# Patient Record
Sex: Male | Born: 1970 | Race: Black or African American | Hispanic: No | Marital: Married | Smoking: Never smoker
Health system: Southern US, Community
[De-identification: ages and names within clinical notes are randomized; demographics above are authoritative.]

## PROBLEM LIST (undated history)

## (undated) DIAGNOSIS — R0683 Snoring: Secondary | ICD-10-CM

## (undated) DIAGNOSIS — Z9989 Dependence on other enabling machines and devices: Principal | ICD-10-CM

## (undated) DIAGNOSIS — I10 Essential (primary) hypertension: Secondary | ICD-10-CM

## (undated) DIAGNOSIS — G4733 Obstructive sleep apnea (adult) (pediatric): Principal | ICD-10-CM

## (undated) DIAGNOSIS — E662 Morbid (severe) obesity with alveolar hypoventilation: Secondary | ICD-10-CM

## (undated) DIAGNOSIS — Z6841 Body Mass Index (BMI) 40.0 and over, adult: Principal | ICD-10-CM

## (undated) HISTORY — DX: Morbid (severe) obesity with alveolar hypoventilation: E66.2

## (undated) HISTORY — DX: Snoring: R06.83

## (undated) HISTORY — DX: Obstructive sleep apnea (adult) (pediatric): G47.33

## (undated) HISTORY — DX: Dependence on other enabling machines and devices: Z99.89

## (undated) HISTORY — DX: Morbid (severe) obesity due to excess calories: E66.01

## (undated) HISTORY — DX: Body Mass Index (BMI) 40.0 and over, adult: Z684

## (undated) HISTORY — DX: Essential (primary) hypertension: I10

---

## 2006-10-06 HISTORY — PX: PITUITARY SURGERY: SHX203

## 2006-12-06 ENCOUNTER — Emergency Department: Payer: Self-pay | Admitting: Emergency Medicine

## 2006-12-11 ENCOUNTER — Ambulatory Visit: Payer: Self-pay | Admitting: Emergency Medicine

## 2008-04-16 IMAGING — CT CT HEAD WITHOUT CONTRAST
2 series · 15 of 30 positions shown, 19 images · non-contrast
Comparison: none

REASON FOR EXAM: headache
COMMENTS:

PROCEDURE:     CT  - CT HEAD WITHOUT CONTRAST  - December 07, 2006  [DATE]
RESULT:
HISTORY: Headache.

[Series 2: without · axial · non-contrast · 0.47mm/px · z∈[-10,+110]mm · 13 of 30 slices shown, 17 images]
[im 3/30  brain]
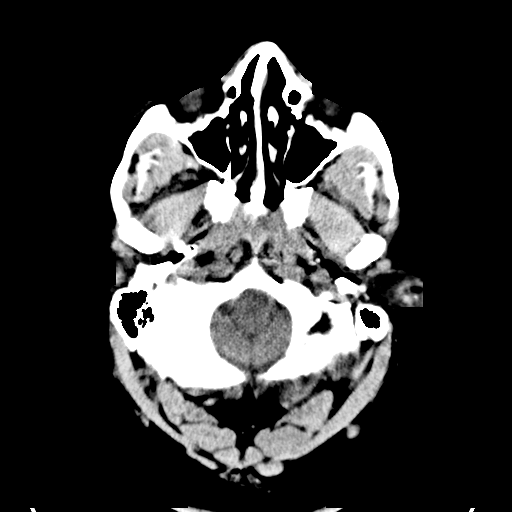
[im 3/30  bone]
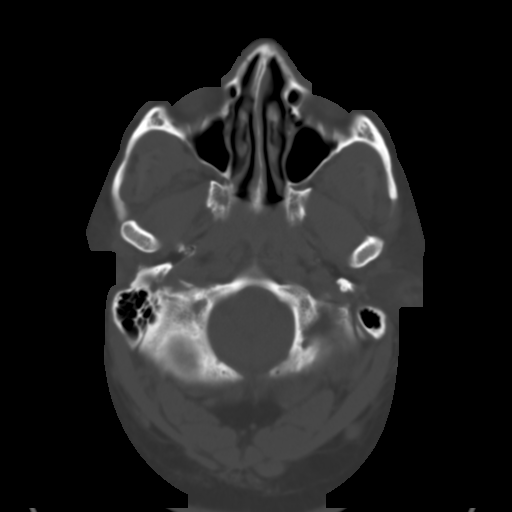
[im 5/30  brain]
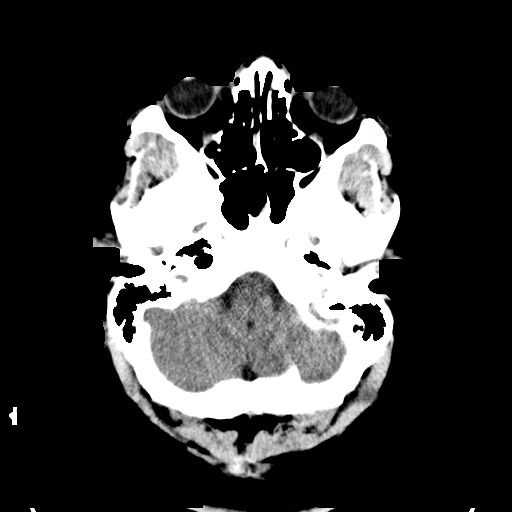
[im 7/30  brain]
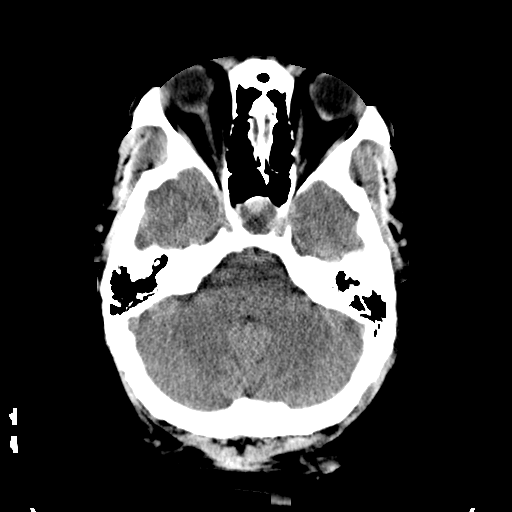
[im 9/30  brain]
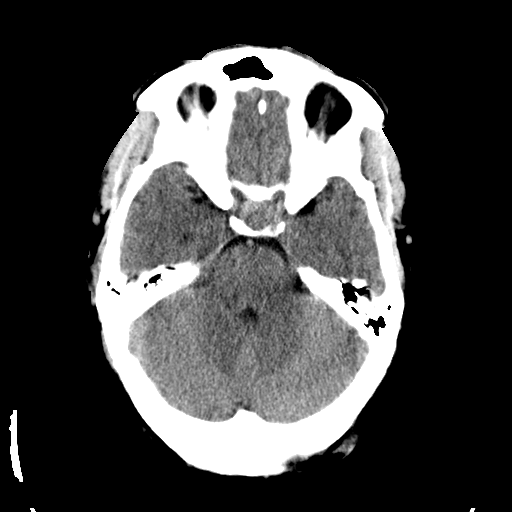
[im 11/30  brain]
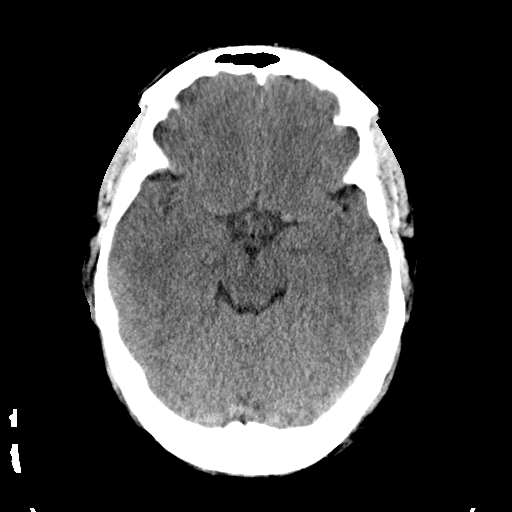
[im 11/30  bone]
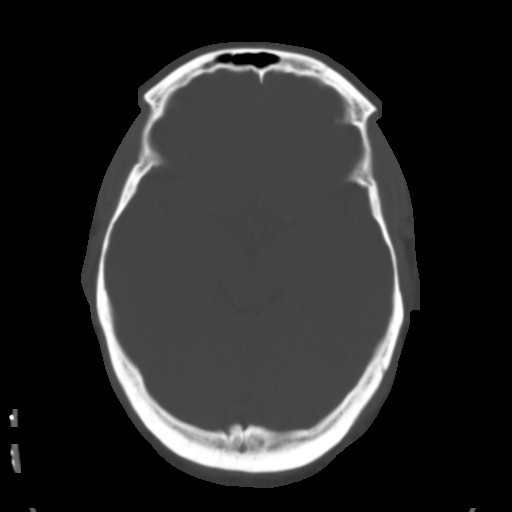
[im 13/30  brain]
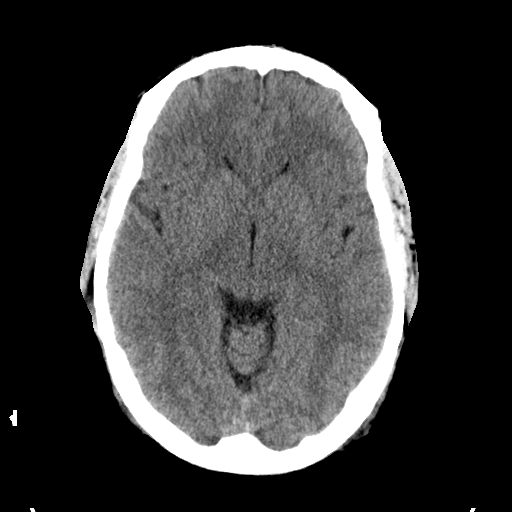
[im 15/30  brain]
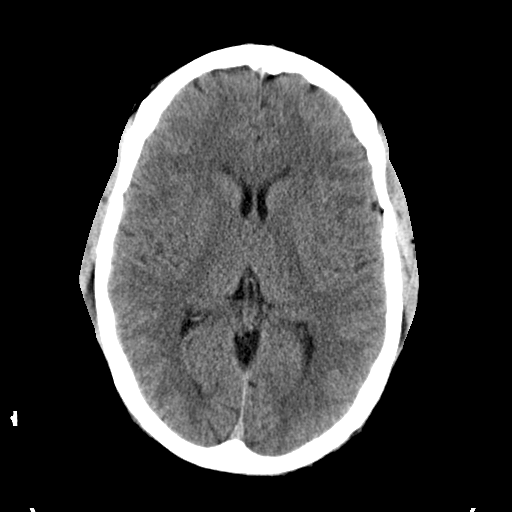
[im 17/30  brain]
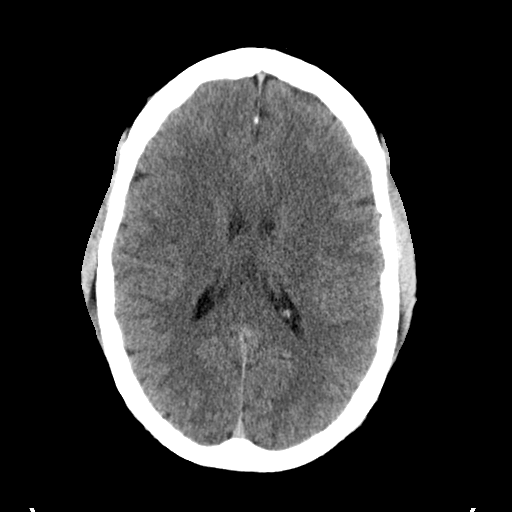
[im 19/30  brain]
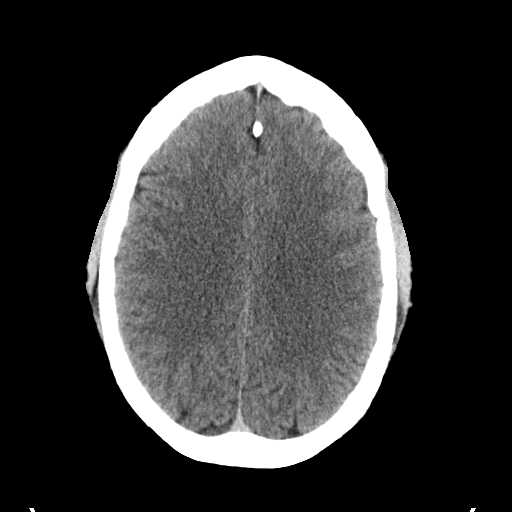
[im 19/30  bone]
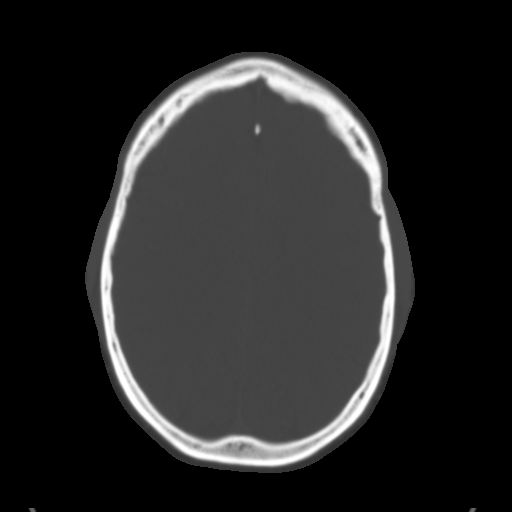
[im 21/30  brain]
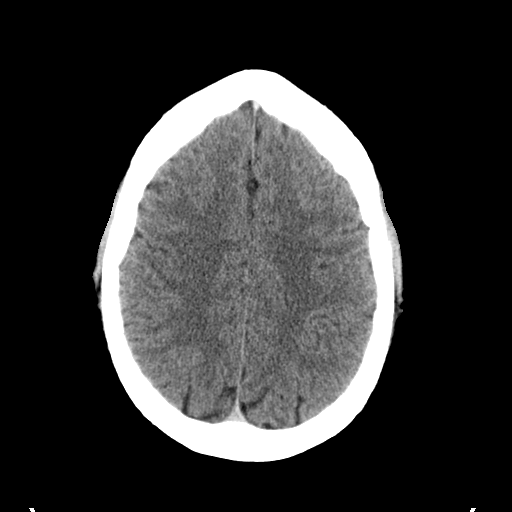
[im 23/30  brain]
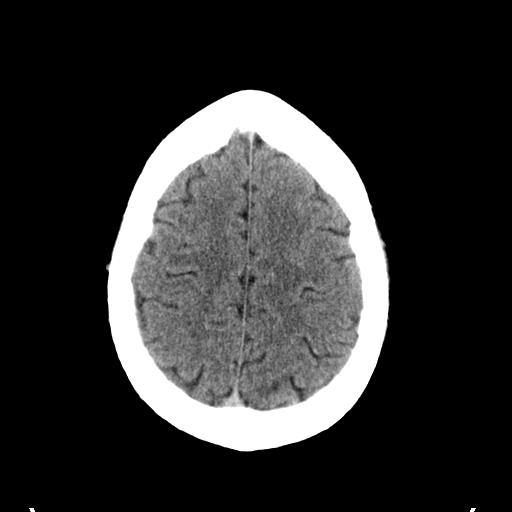
[im 25/30  brain]
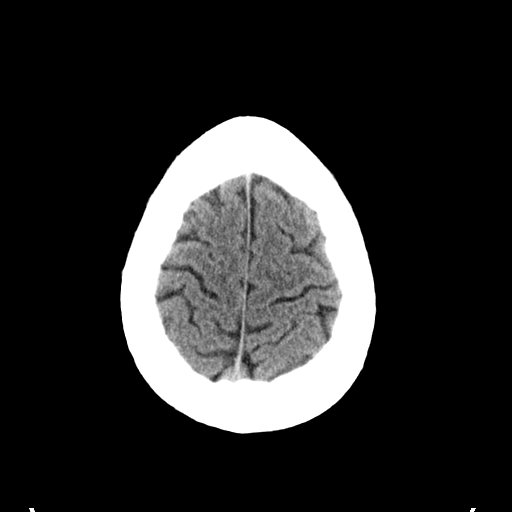
[im 27/30  brain]
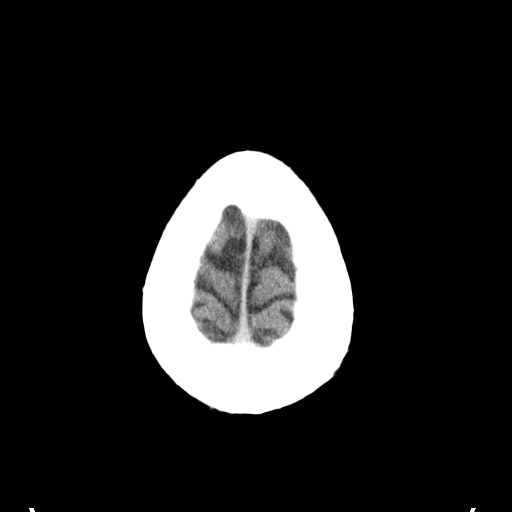
[im 27/30  bone]
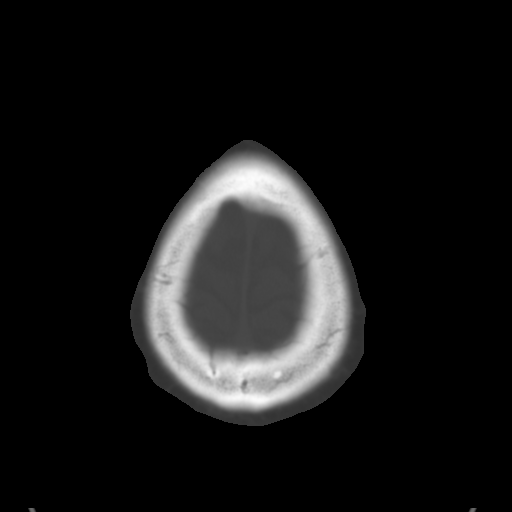

[Series 3: bone · axial · 0.47mm/px · z∈[-10,+10]mm · 2 of 30 slices shown]
[im 3/30  bone]
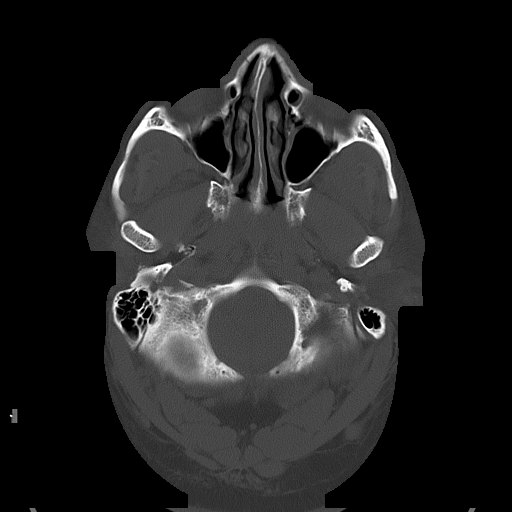
[im 7/30  bone]
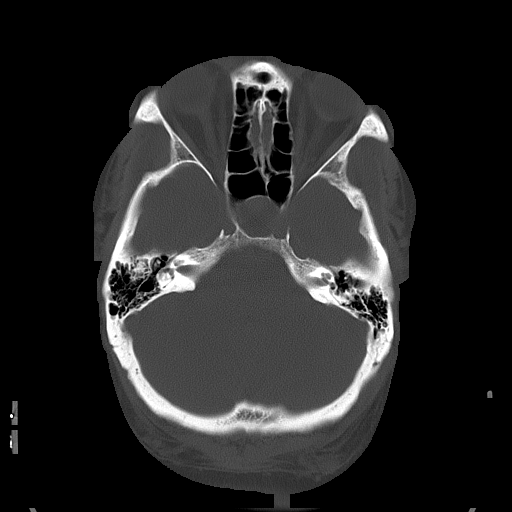

[15 of 30 positions shown; findings below may reference images not displayed]

COMPARISON STUDIES: No recent.

PROCEDURE AND FINDINGS: No intra-axial or extra-axial pathologic fluid or
blood collections identified.  No mass lesion is noted. No hydrocephalus is
noted. Mucosal thickening is noted in the sphenoid sinus.  This is most
consistent with a mucous retention cyst. There is associated fullness in the
pituitary fossa.  An MRI of the brain and pituitary is suggested for further
evaluation to exclude a pituitary mass and to ensure the findings are just
isolated to the adjacent sphenoid sinus. No bony lesions are noted. No acute
intracranial abnormalities are noted.
IMPRESSION: 1.     Soft tissue thickening in the sphenoid sinus consistent with a mucous
retention cyst.
2.     There is mild associated fullness in the pituitary fossa.  MRI of the
Brain and pituitary is suggested for further evaluation to exclude a
pituitary lesion. This report was phoned to the Emergency Room physician at
[DATE] a.m. on 12-07-06.

## 2013-03-11 ENCOUNTER — Institutional Professional Consult (permissible substitution): Payer: 59 | Admitting: Neurology

## 2013-03-15 ENCOUNTER — Encounter: Payer: Self-pay | Admitting: Neurology

## 2013-03-15 ENCOUNTER — Encounter: Payer: Self-pay | Admitting: *Deleted

## 2013-03-15 ENCOUNTER — Ambulatory Visit (INDEPENDENT_AMBULATORY_CARE_PROVIDER_SITE_OTHER): Payer: 59 | Admitting: *Deleted

## 2013-03-15 ENCOUNTER — Ambulatory Visit (INDEPENDENT_AMBULATORY_CARE_PROVIDER_SITE_OTHER): Payer: 59 | Admitting: Neurology

## 2013-03-15 VITALS — BP 148/103 | HR 77 | Temp 99.1°F | Ht 71.0 in | Wt 331.0 lb

## 2013-03-15 VITALS — HR 70

## 2013-03-15 DIAGNOSIS — G4731 Primary central sleep apnea: Secondary | ICD-10-CM

## 2013-03-15 DIAGNOSIS — Z6841 Body Mass Index (BMI) 40.0 and over, adult: Secondary | ICD-10-CM

## 2013-03-15 DIAGNOSIS — G4737 Central sleep apnea in conditions classified elsewhere: Secondary | ICD-10-CM

## 2013-03-15 DIAGNOSIS — G4733 Obstructive sleep apnea (adult) (pediatric): Secondary | ICD-10-CM

## 2013-03-15 DIAGNOSIS — I10 Essential (primary) hypertension: Secondary | ICD-10-CM

## 2013-03-15 NOTE — Assessment & Plan Note (Signed)
HST - follow up by phone , if normal return to PCP.

## 2013-03-15 NOTE — Progress Notes (Signed)
[facility]  Provider:  Dr Vickey Huger Referring Provider: No ref. provider found Primary Care Physician: Dr Dennard Schaumann.  Chief Complaint  Patient presents with  . New Evaluation    evaluation for sleep apnea, [facility], rm 11    HPI:  Dean Li is a 42 y.o. male here as a referral from [facility] .  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 The patient also covers a changing shift between early morning, midday transportation as well as late evening early nighttime drives. 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  His past medical history has been benign, he reports only recently been diagnosed with hypertension and started on a lower dose of lisinopril. 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 He was diagnosed with testosterone deficiency and testosterone was supplemented in form of AndroGel, but soon after his insurance did not longer cover the medication. He was tested for TSH and Vit D levels ( normal) .  The patient had experienced significant weight gain before the microadenoma surgery. His current BMI is 46.2. His weight has varied by about 20 pounds over the last 2 years.  He is unaware of any family history in parents or siblings regarding sleep disorders or apnea.  The patient's sleep times accommodate his change in her hours. If he has to start at 5 AM he will go to bed at about 8 PM the night before, he never naps and bedtime he reports. The patient normally falls asleep promptly and sleeps deep and well. He estimates subjectively his average sleep time is night at night to be between 6 and up to 8 hours.  No nocturia reported, no morning headaches, no parasomnias.  He has no rhinitis and no facial or neck  Injuries.  The  patient's wife reports that she has not noted her husband to be joking or struggling for air, or snoring or having observed any apneas.  Epworth score is 5 points, FSS 24 points, Depression  score is 0 points.           Review of Systems: Out of a complete 14 system review, the patient complains of only the following symptoms, and all other reviewed systems are negative. Epworth- 5 points .  Shift work  Morbidly obese  mallomati 4   History   Social History  . Marital Status: Married    Spouse Name: N/A    Number of Children: 4  . Years of Education: 13   Occupational History  .  [employer]   Social History Main Topics  . Smoking status: Never Smoker   . Smokeless tobacco: Not on file  . Alcohol Use: No  . Drug Use: No  . Sexually Active: Not on file   Other Topics Concern  . Not on file   Social History Narrative  . No narrative on file    Family History  Problem Relation Age of Onset  . Diabetes Mother     Past Medical History  Diagnosis Date  . Hypertension   . Snoring disorder   . Morbid obesity with BMI of 45.0-49.9, adult     Past Surgical History  Procedure Laterality Date  . Pituitary surgery  2008    Current Outpatient Prescriptions  Medication Sig Dispense Refill  . lisinopril (PRINIVIL,ZESTRIL) 10 MG tablet Take 10 mg by mouth daily. One tablet daily at night       No current facility-administered medications for this visit.    Allergies as of 03/15/2013  . (No Known Allergies)    Vitals: BP 148/103  Pulse 77  Temp(Src) 99.1 F (37.3 C) (Oral)  Ht 5\' 11"  (1.803 m)  Wt 331 lb (150.141 kg)  BMI 46.19 kg/m2 Last Weight:  Wt Readings from Last 1 Encounters:  03/15/13 331 lb (150.141 kg)   Last Height:   Ht Readings from Last 1 Encounters:  03/15/13 5\' 11"  (1.803 m)    Physical exam:  General: The patient is awake, alert and appears not in acute distress. The patient is well groomed. Head: Normocephalic,  atraumatic. Neck is supple. Mallampati 4, neck circumference:18, no retrognathia.  He can breath through either nasion.  Cardiovascular:  Regular rate and rhythm , without  murmurs or carotid bruit, and without distended neck veins. Respiratory: Lungs are clear to auscultation. Skin:  Without evidence of edema, or rash Trunk: BMI is 48  elevated .  Neurologic exam : The patient is awake and alert, oriented to place and time.  Memory subjective  described as intact. There is a normal attention span & concentration ability.  Speech is fluent without dysarthria, dysphonia or aphasia.  Mood and affect are appropriate.  Cranial nerves: Pupils are equal and briskly reactive to light. Funduscopic exam without  evidence of pallor or edema. Extraocular movements  in vertical and horizontal planes intact and without nystagmus. Visual fields by finger perimetry are intact. Hearing to finger rub intact.  Facial sensation intact to fine touch. Facial motor strength is symmetric and tongue and uvula move midline.  Motor exam:   Normal tone and normal muscle bulk and symmetric normal strength in all extremities.  Sensory:  Fine touch, pinprick and vibration were tested in all extremities. Proprioception is tested in the upper extremities only. This was   normal.  Coordination: Rapid alternating movements in the fingers/hands is tested and normal. Finger-to-nose maneuver tested and normal without evidence of ataxia, dysmetria or tremor.  Gait and station: Patient walks without assistive device . Strength within normal limits. Stance is stable and normal. Tandem gait is impaired by obesity  Unfragmented.  Romberg testing is normal.  Deep tendon reflexes: in the  upper and lower extremities are symmetric and intact. Babinski maneuver response is downgoing.   Assessment:  After physical and neurologic examination, review of laboratory studies, imaging, neurophysiology testing and pre-existing records,  assessment will be reviewed on the problem list.  Plan:  Treatment plan and additional workup will be reviewed under Problem List.  This otherwise healthy patient was referred based on his body mass index, this neck circumference and has indeed high-grade Mallampati.  He denies any excessive daytime sleepiness and endorsed the Epworth score at 5 points. His wife confirms that he sleeps between 6 and 8 hours, and seems to be resting well. She has not noticed any struggle with snoring has not witnessed apneas, and the patient has no nocturia or morning headaches.  His insurance is asking for a home sleep test instead of an  attended sleep test in lab/ office. I will place an order for home sleep test, the patient will receive instructions from my technician on how to use and how to apply the recorder. He will be asked to have the machine attached for at least 6 hours.  Other differential the patient also some educational  material about sleep apnea, the risk factors and and the health risks that may arise from untreated sleep apnea.  Again, at this time no  Diagnosis is  made.

## 2013-03-15 NOTE — Progress Notes (Signed)
Patient arrives for HST instruction.  Patient is given written instructions, picture instructions, and a demonstration on how to use HST unit.  All questions / concerns were addressed by technologist.  Financial responsibility was explained.  Follow up information was given to patient regarding how results would be received.  Kissa will contact pt to let him know financial responsibility.

## 2013-03-15 NOTE — Patient Instructions (Signed)
Sleep Apnea Sleep apnea is disorder that affects a person's sleep. A person with sleep apnea has abnormal pauses in their breathing when they sleep. It is hard for them to get a good sleep. This makes a person tired during the day. It also can lead to other physical problems. There are three types of sleep apnea. One type is when breathing stops for a short time because your airway is blocked (obstructive sleep apnea). Another type is when the brain sometimes fails to give the normal signal to breathe to the muscles that control your breathing (central sleep apnea). The third type is a combination of the other two types. HOME CARE  Do not sleep on your back. Try to sleep on your side.  Take all medicine as told by your doctor.  Avoid alcohol, calming medicines (sedatives), and depressant drugs.  Try to lose weight if you are overweight. Talk to your doctor about a healthy weight goal. Your doctor may have you use a device that helps to open your airway. It can help you get the air that you need. It is called a positive airway pressure (PAP) device. There are three types of PAP devices:  Continuous positive airway pressure (CPAP) device.  Nasal expiratory positive airway pressure (EPAP) device.  Bilevel positive airway pressure (BPAP) device. MAKE SURE YOU:  Understand these instructions.  Will watch your condition.  Will get help right away if you are not doing well or get worse. Document Released: 07/01/2008 Document Revised: 09/08/2012 Document Reviewed: 01/24/2012 ExitCare Patient Information 2014 ExitCare, [place]. 1.5 Gram Low Sodium Diet A 1.5 gram sodium diet restricts the amount of sodium in the diet to no more than 1.5 g or 1500 mg daily. The American Heart Association recommends Americans over the age of 75 to consume no more than 1500 mg of sodium each day to reduce the risk of developing high blood pressure. Research also shows that limiting sodium may reduce heart attack and  stroke risk. Many foods contain sodium for flavor and sometimes as a preservative. When the amount of sodium in a diet needs to be low, it is important to know what to look for when choosing foods and drinks. The following includes some information and guidelines to help make it easier for you to adapt to a low sodium diet. QUICK TIPS  Do not add salt to food.  Avoid convenience items and fast food.  Choose unsalted snack foods.  Buy lower sodium products, often labeled as "lower sodium" or "no salt added."  Check food labels to learn how much sodium is in 1 serving.  When eating at a restaurant, ask that your food be prepared with less salt or none, if possible. READING FOOD LABELS FOR SODIUM INFORMATION The nutrition facts label is a good place to find how much sodium is in foods. Look for products with no more than 400 mg of sodium per serving. Remember that 1.5 g = 1500 mg. The food label may also list foods as:  Sodium-free: Less than 5 mg in a serving.  Very low sodium: 35 mg or less in a serving.  Low-sodium: 140 mg or less in a serving.  Light in sodium: 50% less sodium in a serving. For example, if a food that usually has 300 mg of sodium is changed to become light in sodium, it will have 150 mg of sodium.  Reduced sodium: 25% less sodium in a serving. For example, if a food that usually has 400 mg of  sodium is changed to reduced sodium, it will have 300 mg of sodium. CHOOSING FOODS Grains  Avoid: Salted crackers and snack items. Some cereals, including instant hot cereals. Bread stuffing and biscuit mixes. Seasoned rice or pasta mixes.  Choose: Unsalted snack items. Low-sodium cereals, oats, puffed wheat and rice, shredded wheat. English muffins and bread. Pasta. Meats  Avoid: Salted, canned, smoked, spiced, pickled meats, including fish and poultry. Bacon, ham, sausage, cold cuts, hot dogs, anchovies.  Choose: Low-sodium canned tuna and salmon. Fresh or frozen meat,  poultry, and fish. Dairy  Avoid: Processed cheese and spreads. Cottage cheese. Buttermilk and condensed milk. Regular cheese.  Choose: Milk. Low-sodium cottage cheese. Yogurt. Sour cream. Low-sodium cheese. Fruits and Vegetables  Avoid: Regular canned vegetables. Regular canned tomato sauce and paste. Frozen vegetables in sauces. Olives. Rosita Fire. Relishes. Sauerkraut.  Choose: Low-sodium canned vegetables. Low-sodium tomato sauce and paste. Frozen or fresh vegetables. Fresh and frozen fruit. Condiments  Avoid: Canned and packaged gravies. Worcestershire sauce. Tartar sauce. Barbecue sauce. Soy sauce. Steak sauce. Ketchup. Onion, garlic, and table salt. Meat flavorings and tenderizers.  Choose: Fresh and dried herbs and spices. Low-sodium varieties of mustard and ketchup. Lemon juice. Tabasco sauce. Horseradish. SAMPLE 1.5 GRAM SODIUM MEAL PLAN Breakfast / Sodium (mg)  1 cup low-fat milk / 143 mg  1 whole-wheat English muffin / 240 mg  1 tbs heart-healthy margarine / 153 mg  1 hard-boiled egg / 139 mg  1 small orange / 0 mg Lunch / Sodium (mg)  1 cup raw carrots / 76 mg  2 tbs no salt added peanut butter / 5 mg  2 slices whole-wheat bread / 270 mg  1 tbs jelly / 6 mg   cup red grapes / 2 mg Dinner / Sodium (mg)  1 cup whole-wheat pasta / 2 mg  1 cup low-sodium tomato sauce / 73 mg  3 oz lean ground beef / 57 mg  1 small side salad (1 cup raw spinach leaves,  cup cucumber,  cup yellow bell pepper) with 1 tsp olive oil and 1 tsp red wine vinegar / 25 mg Snack / Sodium (mg)  1 container low-fat vanilla yogurt / 107 mg  3 graham cracker squares / 127 mg Nutrient Analysis  Calories: 1745  Protein: 75 g  Carbohydrate: 237 g  Fat: 57 g  Sodium: 1425 mg Document Released: 09/22/2005 Document Revised: 12/15/2011 Document Reviewed: 12/24/2009 ExitCare Patient Information 2014 [place], [place]. Exercise to Lose Weight Exercise and a healthy diet may help you  lose weight. Your doctor may suggest specific exercises. EXERCISE IDEAS AND TIPS  Choose low-cost things you enjoy doing, such as walking, bicycling, or exercising to workout videos.  Take stairs instead of the elevator.  Walk during your lunch break.  Park your car further away from work or school.  Go to a gym or an exercise class.  Start with 5 to 10 minutes of exercise each day. Build up to 30 minutes of exercise 4 to 6 days a week.  Wear shoes with good support and comfortable clothes.  Stretch before and after working out.  Work out until you breathe harder and your heart beats faster.  Drink extra water when you exercise.  Do not do so much that you hurt yourself, feel dizzy, or get very short of breath. Exercises that burn about 150 calories:  Running 1  miles in 15 minutes.  Playing volleyball for 45 to 60 minutes.  Washing and waxing a car for 45 to  60 minutes.  Playing touch football for 45 minutes.  Walking 1  miles in 35 minutes.  Pushing a stroller 1  miles in 30 minutes.  Playing basketball for 30 minutes.  Raking leaves for 30 minutes.  Bicycling 5 miles in 30 minutes.  Walking 2 miles in 30 minutes.  Dancing for 30 minutes.  Shoveling snow for 15 minutes.  Swimming laps for 20 minutes.  Walking up stairs for 15 minutes.  Bicycling 4 miles in 15 minutes.  Gardening for 30 to 45 minutes.  Jumping rope for 15 minutes.  Washing windows or floors for 45 to 60 minutes. Document Released: 10/25/2010 Document Revised: 12/15/2011 Document Reviewed: 10/25/2010 [facility] Patient Information 2014 [place], [place].

## 2013-04-01 ENCOUNTER — Other Ambulatory Visit: Payer: Self-pay | Admitting: *Deleted

## 2013-04-04 NOTE — Progress Notes (Signed)
See media tab for full report  

## 2013-04-05 ENCOUNTER — Other Ambulatory Visit: Payer: Self-pay | Admitting: Neurology

## 2013-04-05 DIAGNOSIS — G4731 Primary central sleep apnea: Secondary | ICD-10-CM

## 2013-04-05 DIAGNOSIS — G4733 Obstructive sleep apnea (adult) (pediatric): Secondary | ICD-10-CM

## 2013-04-07 ENCOUNTER — Ambulatory Visit (INDEPENDENT_AMBULATORY_CARE_PROVIDER_SITE_OTHER): Payer: 59 | Admitting: Neurology

## 2013-04-07 VITALS — BP 137/87

## 2013-04-07 DIAGNOSIS — G4733 Obstructive sleep apnea (adult) (pediatric): Secondary | ICD-10-CM

## 2013-04-07 DIAGNOSIS — G4731 Primary central sleep apnea: Secondary | ICD-10-CM

## 2013-04-11 ENCOUNTER — Encounter: Payer: Self-pay | Admitting: *Deleted

## 2013-04-14 ENCOUNTER — Telehealth: Payer: Self-pay | Admitting: Neurology

## 2013-04-14 DIAGNOSIS — G4733 Obstructive sleep apnea (adult) (pediatric): Secondary | ICD-10-CM

## 2013-04-14 NOTE — Telephone Encounter (Signed)
Patient with highly abnormal HST - severe sleep apnea , possible hypoventilation syndrome ( BMI over 40) , Returned for titration, needs CPAP at 15 cm with eson mask, nasal , large and 2 cm EPR .  patient did not answer mobile phone and has no VM set up.  04-07-13  Melvyn Novas, MD

## 2013-04-15 ENCOUNTER — Encounter: Payer: Self-pay | Admitting: *Deleted

## 2013-04-15 NOTE — Telephone Encounter (Signed)
Called patient to discuss sleep study results from  04/07/2013.  Discussed findings, recommendations and follow up care.  Patient understood well and all questions were answered.  He understands orders will be forwarded to a DME company and they will contact him next.  He requests a company that is either in [place], [place] or somewhere in between.  He lives in [place] and works in [place], this will make it easy for him to pick up.  Searched a map and [facility] is both contracted with his [employer], and they have a location in [place].  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  Copy of study will be forwarded to referring MD Dennard Schaumann.  Copy of report will be mailed to patient with follow up letter.  Patient opted to call in to schedule 30 day post CPAP appt once he receives his CPAP.  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

## 2013-06-03 ENCOUNTER — Encounter: Payer: Self-pay | Admitting: Neurology

## 2013-07-01 ENCOUNTER — Ambulatory Visit: Payer: 59 | Admitting: Neurology

## 2013-07-11 ENCOUNTER — Ambulatory Visit: Payer: 59 | Admitting: Neurology

## 2014-12-25 ENCOUNTER — Encounter: Payer: Self-pay | Admitting: Neurology

## 2014-12-25 ENCOUNTER — Ambulatory Visit (INDEPENDENT_AMBULATORY_CARE_PROVIDER_SITE_OTHER): Payer: 59 | Admitting: Neurology

## 2014-12-25 VITALS — BP 129/85 | HR 82 | Resp 12 | Ht 71.0 in | Wt 349.4 lb

## 2014-12-25 DIAGNOSIS — E662 Morbid (severe) obesity with alveolar hypoventilation: Secondary | ICD-10-CM | POA: Diagnosis not present

## 2014-12-25 DIAGNOSIS — G4733 Obstructive sleep apnea (adult) (pediatric): Secondary | ICD-10-CM | POA: Diagnosis not present

## 2014-12-25 DIAGNOSIS — Z9989 Dependence on other enabling machines and devices: Principal | ICD-10-CM

## 2014-12-25 HISTORY — DX: Obstructive sleep apnea (adult) (pediatric): G47.33

## 2014-12-25 HISTORY — DX: Morbid (severe) obesity with alveolar hypoventilation: E66.2

## 2014-12-25 NOTE — Progress Notes (Signed)
[facility]  Provider:  Dr Brett Fairy Referring Provider: No ref. provider found Primary Care Physician: Dr Rosalie Doctor.  Chief Complaint  Patient presents with  . RV sleep    Rm 10, wife    HPI:  Dean Li is a 44 y.o. male , was seen here in 2014  as a referral from [facility]. [facility] . DOT Driver with OSA/ Obesity HYPOVENT.  Dean Li had been seen by me in 2014 when I ordered a sleep study for him. The patient was diagnosed care home sleep test performed on 03-15-13 with an AHI of 86, RDI of 88 oxygen nadir 55%. In spite of the severe form of obstructive sleep apnea the patient was not endorsing a high degree of sleepiness at the time. His BMI at the time of testing was 46.2. The patient was titrated to CPAP but still had some borderline desaturations his lowest oxygen nadir during the CPAP titration was 86% was 46 minutes of desaturation his AHI was 2.1 his RDI 2.1. Also apneas weren't seen in supine sleep. Interestingly REM sleep seems not to have exacerbated his apneas. He did not have limb movements that woke him at night he was neither kit kicking more tossing and turning. His heart rate was regular. The diagnosis was severe obstructive sleep apnea with hypoxemia.  The patient was titrated to CPAP at 15 cm water the 3 cm flex function. He is here today for follow-up appointment.  The patient brought a compliance report from his CPAP machine. The machine is still set at 15 cm water with 2 cm EPR, his average time on CPAP nightly is 5 hours and 28 minutes. His residual AHI is 2.6. He does have some air leakage the this is from a breakage of the air seal at the interface level.  He has a 90% compliance for 27 out of 30 days and for over 4 hours his compliance is 87%.   The patient fulfills the DOT criteria and the insurance criteria for compliance with CPAP therapy. He is here today for a revisit to be able to opt pain replacements parts for his CPAP. The  patient will be seen once a year in a 15 minute revisit from now on.       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 The patient also covers a changing shift between early morning, midday transportation as well as late evening early nighttime drives. 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 His past medical history has been benign, he reports only recently been diagnosed with hypertension and started on a lower dose of lisinopril. 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 He was diagnosed with testosterone deficiency and testosterone was supplemented in form of AndroGel, but soon after his insurance did not longer cover the medication. He was tested for TSH and Vit D levels ( normal) .  The patient had experienced significant weight gain before the microadenoma surgery. His current BMI is 46.2. His weight has varied by about 20 pounds over the last 2 years.  He is unaware of any family history in parents or siblings regarding sleep disorders or apnea. The patient's sleep times accommodate his change in her hours. If he  has to start at 5 AM he will go to bed at about 8 PM the night before, he never naps and bedtime he reports. The patient normally falls asleep promptly and sleeps deep and well. He estimates subjectively his average sleep time is night at night to be between 6 and up to 8 hours.  No nocturia reported, no morning headaches, no parasomnias.  He has no rhinitis and no facial or neck  Injuries.  The patient's wife reports that she has not noted her husband to be joking or struggling for air, or snoring or having observed any apneas. Epworth score is 5 points, FSS 24 points, Depression  score is 0 points.            Review of Systems: Out of a complete 14 system review, the patient complains of only the following symptoms, and all other reviewed systems are negative. Epworth- 2 from  5 points in 2014 , FSS 9 points. .  Further risk factors for sleep disorder are the patient's shift work schedule. The BMI. The narrow upper airway. The patient uses and nasal mask and also he wears a beard he does not wear mustache,  Shift worker  Morbidly obese - BMI 48.75  mallompati 4   History   Social History  . Marital Status: Married    Spouse Name: N/A  . Number of Children: 4  . Years of Education: 13   Occupational History  .  [employer]   Social History Main Topics  . Smoking status: Never Smoker   . Smokeless tobacco: Not on file  . Alcohol Use: No  . Drug Use: No  . Sexual Activity: Not on file   Other Topics Concern  . Not on file   Social History Narrative   Caffeine Green Tea Diet last 2 months.     Family History  Problem Relation Age of Onset  . Diabetes Mother     Past Medical History  Diagnosis Date  . Hypertension   . Snoring disorder   . Morbid obesity with BMI of 45.0-49.9, adult   . OSA on CPAP 12/25/2014  . Obesity hypoventilation syndrome 12/25/2014    Past Surgical History  Procedure Laterality Date  . Pituitary surgery  2008    No current outpatient prescriptions on file.   No current facility-administered medications for this visit.    Allergies as of 12/25/2014  . (No Known Allergies)    Vitals: BP 129/85 mmHg  Pulse 82  Resp 12  Ht 5' 11"  (1.803 m)  Wt 349 lb 6.4 oz (158.487 kg)  BMI 48.75 kg/m2 Last Weight:  Wt Readings from Last 1 Encounters:  12/25/14 349 lb 6.4 oz (158.487 kg)   Last Height:   Ht Readings from Last 1 Encounters:  12/25/14 5' 11"  (1.803 m)    Physical exam:  General: The patient is awake, alert and appears not in acute distress. The patient is well groomed. Head: Normocephalic,  atraumatic. Neck is supple. Mallampati 4, neck circumference:18, no retrognathia.  He can breath through either nasion.  Cardiovascular:  Regular rate and rhythm , without  murmurs or carotid bruit, and without distended neck veins. Respiratory: Lungs are clear to auscultation. Skin:  Without evidence of edema, or rash Trunk: BMI is 48  elevated .  Neurologic exam : The patient is awake and alert, oriented to place and time.   Speech is fluent without dysarthria, dysphonia or aphasia.  Mood and affect are appropriate.  Cranial  nerves: Pupils are equal and briskly reactive to light.  Extraocular movements  in vertical and horizontal planes intact and without nystagmus. Visual fields by finger perimetry are intact. Hearing to finger rub intact.  Facial motor strength is symmetric and tongue and uvula move midline.normal shoulder shrug and ROM.  Motor exam:   Normal tone and normal muscle bulk and symmetric normal strength in all extremities.  Sensory:  Fine touch, pinprick and vibration were normal in all extremities. Proprioception is normal.  Coordination: Rapid alternating movements in the fingers/hands is tested and normal.  Finger-to-nose maneuver normal without evidence of ataxia, dysmetria or tremor.  Gait and station: Patient walks without assistive device . Strength within normal limits. Stance is stable and normal.  Deep tendon reflexes: in the  upper and lower extremities are symmetric and intact. Babinski maneuver response is downgoing.   Assessment:  After physical and neurologic examination, review of laboratory studies, imaging, neurophysiology testing and pre-existing records, assessment -  OSA, OHS, Obesity.   Plan:  Treatment plan and additional workup will be reviewed under Problem List.  12-25-14  This otherwise healthy patient was referred based on his body mass index, this neck circumference and has indeed high-grade Mallampati. He denies any excessive daytime  sleepiness and endorsed the Epworth score at 2 points. His wife confirms that he sleeps between 6 and 8 hours, and seems to be resting well, using CPAP.  No  nocturia.  His insurance was asking for a home sleep test instead of an attended sleep test in lab/ office. This documented a very high AHI.  The patient is well compliant and consistently treated with CPAP at AHI 2.6 .  The setting of the CPAP will remain at 15 cm water with 2 cm EPR. The patient fulfills the compliance requirement of the DOT as well as of his insurance.   His [employer] [facility] [place].

## 2014-12-25 NOTE — Patient Instructions (Signed)

## 2014-12-26 ENCOUNTER — Telehealth: Payer: Self-pay | Admitting: Neurology

## 2014-12-26 NOTE — Telephone Encounter (Signed)
Delana from [facility] is just calling to give you a fax number [phone].

## 2014-12-26 NOTE — Telephone Encounter (Signed)
I faxed to this number yesterday 12-25-14 at 1424.

## 2015-01-01 ENCOUNTER — Encounter: Payer: Self-pay | Admitting: Neurology

## 2016-08-30 ENCOUNTER — Emergency Department
Admission: EM | Admit: 2016-08-30 | Discharge: 2016-08-30 | Disposition: A | Discharge status: Home / Self Care | Payer: 59 | Attending: Emergency Medicine | Admitting: Emergency Medicine

## 2016-08-30 ENCOUNTER — Encounter: Payer: Self-pay | Admitting: Emergency Medicine

## 2016-08-30 DIAGNOSIS — I10 Essential (primary) hypertension: Secondary | ICD-10-CM | POA: Diagnosis not present

## 2016-08-30 DIAGNOSIS — K029 Dental caries, unspecified: Secondary | ICD-10-CM | POA: Diagnosis not present

## 2016-08-30 DIAGNOSIS — K0889 Other specified disorders of teeth and supporting structures: Secondary | ICD-10-CM

## 2016-08-30 DIAGNOSIS — K047 Periapical abscess without sinus: Secondary | ICD-10-CM

## 2016-08-30 MED ORDER — HYDROCODONE-ACETAMINOPHEN 5-325 MG PO TABS
2.0000 | ORAL_TABLET | Freq: Once | ORAL | Status: AC
  Administered 2016-08-30: 2 via ORAL
  Filled 2016-08-30: qty 2

## 2016-08-30 MED ORDER — PENICILLIN V POTASSIUM 500 MG PO TABS: 500.0000 mg | ORAL_TABLET | Freq: Four times a day (QID) | ORAL | 0 refills | Status: AC

## 2016-08-30 MED ORDER — DOCUSATE SODIUM 100 MG PO CAPS: ORAL_CAPSULE | ORAL | 0 refills | Status: AC

## 2016-08-30 MED ORDER — HYDROCODONE-ACETAMINOPHEN 5-325 MG PO TABS: 1.0000 | ORAL_TABLET | ORAL | 0 refills | Status: AC | PRN

## 2016-08-30 MED ORDER — PENICILLIN V POTASSIUM 250 MG PO TABS
500.0000 mg | ORAL_TABLET | ORAL | Status: AC
  Administered 2016-08-30: 500 mg via ORAL
  Filled 2016-08-30: qty 2

## 2016-08-30 NOTE — ED Triage Notes (Signed)
Pt states that pain in his lower left jaw started about a month ago. Pt states that the last two day the pain has increased and become unbearable. Pt in NAD in triage.

## 2016-08-30 NOTE — ED Notes (Signed)
Pt complains of most posterior left lower molar pain. Slight swelling around gumline noted.

## 2016-08-30 NOTE — ED Provider Notes (Signed)
[facility] Emergency Department Provider Note  ____________________________________________   First MD Initiated Contact with Patient 08/30/16 [phone]     (approximate)  I have reviewed the triage vital signs and the nursing notes.   HISTORY  Chief Complaint Dental Pain    HPI Dean Li is a 45 y.o. male reports no chronic medical issues and presents for evaluation of gradually worsening pain around his rear left lower molars.  He states that he has a bad tooth and needs to see a dentist.  He has had some swelling around the tooth, sensitivity to temperature extremes, but no difficulty swallowing.  He denies fever/chills, chest pain, shortness of breath, nausea, vomiting.  He states the pain is severe and nothing makes it better.  He has been taking ibuprofen with little relief.   Past Medical History:  Diagnosis Date  . Hypertension   . Morbid obesity with BMI of 45.0-49.9, adult (HCC)   . Obesity hypoventilation syndrome (HCC) 12/25/2014  . OSA on CPAP 12/25/2014  . Snoring disorder     Patient Active Problem List   Diagnosis Date Noted  . OSA on CPAP 12/25/2014  . Obesity hypoventilation syndrome (HCC) 12/25/2014  . Morbid obesity with BMI of 45.0-49.9, adult [facility](HCC)     Past Surgical History:  Procedure Laterality Date  . PITUITARY SURGERY  2008    Prior to Admission medications   Medication Sig Start Date End Date Taking? Authorizing Provider  docusate sodium (COLACE) 100 MG capsule Take 1 tablet once or twice daily as needed for constipation while taking narcotic pain medicine 08/30/16   Loleta Roseory Keerthi Hazell, MD  HYDROcodone-acetaminophen (NORCO/VICODIN) 5-325 MG tablet Take 1-2 tablets by mouth every 4 (four) hours as needed for moderate pain. 08/30/16   Loleta Roseory Jolea Dolle, MD  penicillin v potassium (VEETID) 500 MG tablet Take 1 tablet (500 mg total) by mouth 4 (four) times daily. 08/30/16   Loleta Roseory Cache Bills, MD    Allergies Patient has no known  allergies.  Family History  Problem Relation Age of Onset  . Diabetes Mother     Social History Social History  Substance Use Topics  . Smoking status: Never Smoker  . Smokeless tobacco: Never Used  . Alcohol use No    Review of Systems Constitutional: No fever/chills ENT: Left rear bottom molar pain and swelling. No sore throat. Cardiovascular: Denies chest pain. Respiratory: Denies shortness of breath. Gastrointestinal: No abdominal pain.  No nausea, no vomiting.  No diarrhea.  No constipation.  ____________________________________________   PHYSICAL EXAM:  VITAL SIGNS: ED Triage Vitals  Enc Vitals Group     BP 08/30/16 0356 (!) 163/82     Pulse Rate 08/30/16 0356 81     Resp --      Temp 08/30/16 0356 98.4 F (36.9 C)     Temp Source 08/30/16 0356 Oral     SpO2 08/30/16 0356 98 %     Weight --      Height --      Head Circumference --      Peak Flow --      Pain Score 08/30/16 0403 8     Pain Loc --      Pain Edu? --      Excl. in GC? --     Constitutional: Alert and oriented. Well appearing and in no acute distress. Eyes: Conjunctivae are normal. PERRL. EOMI. Head: Atraumatic. Nose: No congestion/rhinnorhea. Mouth/Throat: Mucous membranes are moist.  Oropharynx non-erythematous. Mild swelling lateral to  tooth #17 without fluctuance or induration.  No evidence of spreading soft tissue infection.  No evidence of Ludwig's angina.  No trismus Neck: No stridor.  No meningeal signs.   Cardiovascular: Normal rate, regular rhythm. Good peripheral circulation.  Respiratory: Normal respiratory effort.  No retractions.  Psychiatric: Mood and affect are normal. Speech and behavior are normal.  ____________________________________________   LABS (all labs ordered are listed, but only abnormal results are displayed)  Labs Reviewed - No data to display ____________________________________________  EKG  None - EKG not ordered by ED  physician ____________________________________________  RADIOLOGY   No results found.  ____________________________________________   PROCEDURES  Procedure(s) performed:   Procedures   Critical Care performed: No ____________________________________________   INITIAL IMPRESSION / ASSESSMENT AND PLAN / ED COURSE  Pertinent labs & imaging results that were available during my care of the patient were reviewed by me and considered in my medical decision making (see chart for details).  No evidence of emergent condition.  Will treat with PenVK.  I reviewed the patient's prescription history over the last 12 months in the [place] Controlled Substances Database, and he has had no controlled substances prescribed in the last year.  I will give him a short course of Norco to help with immediate pain until he can follow up with a dentist early next week.  I gave my usual and customary return precautions.       ____________________________________________  FINAL CLINICAL IMPRESSION(S) / ED DIAGNOSES  Final diagnoses:  Pain, dental  Dental caries  Dental abscess     MEDICATIONS GIVEN DURING THIS VISIT:  Medications  HYDROcodone-acetaminophen (NORCO/VICODIN) 5-325 MG per tablet 2 tablet (2 tablets Oral Given 08/30/16 0717)  penicillin v potassium (VEETID) tablet 500 mg (500 mg Oral Given 08/30/16 0717)     NEW OUTPATIENT MEDICATIONS STARTED DURING THIS VISIT:  New Prescriptions   DOCUSATE SODIUM (COLACE) 100 MG CAPSULE    Take 1 tablet once or twice daily as needed for constipation while taking narcotic pain medicine   HYDROCODONE-ACETAMINOPHEN (NORCO/VICODIN) 5-325 MG TABLET    Take 1-2 tablets by mouth every 4 (four) hours as needed for moderate pain.   PENICILLIN V POTASSIUM (VEETID) 500 MG TABLET    Take 1 tablet (500 mg total) by mouth 4 (four) times daily.    Modified Medications   No medications on file    Discontinued Medications   No medications on file      Note:  This document was prepared using Dragon voice recognition software and may include unintentional dictation errors.    Loleta Roseory Printice Hellmer, MD 08/30/16 [phone]

## 2016-08-30 NOTE — ED Notes (Signed)

## 2016-08-30 NOTE — Discharge Instructions (Signed)
You have been seen in the Emergency Department (ED) today for dental pain.  Please take your prescribed antibiotic.  You may take pain medication as needed but ONLY as prescribed.  You should also take over-the-counter pain medication such as ibuprofen according to the label instructions unless a doctor has previously told you to avoid this type of medication (due to stomach ulcers, for example).  Alternatively you can take ibuprofen 600 mg by mouth three times daily with meals for no more than 5 days. ° °Take Norco as prescribed for severe pain. Do not drink alcohol, drive or participate in any other potentially dangerous activities while taking this medication as it may make you sleepy. Do not take this medication with any other sedating medications, either prescription or over-the-counter. If you were prescribed Percocet or Vicodin, do not take these with acetaminophen (Tylenol) as it is already contained within these medications. °  °This medication is an opiate (or narcotic) pain medication and can be habit forming.  Use it as little as possible to achieve adequate pain control.  Do not use or use it with extreme caution if you have a history of opiate abuse or dependence.  If you are on a pain contract with your primary care doctor or a pain specialist, be sure to let them know you were prescribed this medication today from the [facility].  This medication is intended for your use only - do not give any to anyone else and keep it in a secure place where nobody else, especially children, have access to it.  It will also cause or worsen constipation, so you may want to consider taking an over-the-counter stool softener while you are taking this medication. ° °Please see you dentist as soon as possible; only a dentist will be able to fix your problem(s).  Please see below for dental follow up options. ° °Return to the ED if you develop worsening pain, fever, pus/drainage, difficulty  breathing, or other symptoms that concern you. °
